# Patient Record
Sex: Male | Born: 1950 | Race: White | Hispanic: No | Marital: Married | Smoking: Never smoker
Health system: Southern US, Community
[De-identification: ages and names within clinical notes are randomized; demographics above are authoritative.]

## PROBLEM LIST (undated history)

## (undated) DIAGNOSIS — N400 Enlarged prostate without lower urinary tract symptoms: Secondary | ICD-10-CM

---

## 2008-04-11 ENCOUNTER — Observation Stay (HOSPITAL_COMMUNITY): Admission: EM | Admit: 2008-04-11 | Discharge: 2008-04-12 | Payer: Self-pay | Admitting: Emergency Medicine

## 2008-07-06 ENCOUNTER — Emergency Department (HOSPITAL_COMMUNITY): Admission: EM | Admit: 2008-07-06 | Discharge: 2008-07-06 | Payer: Self-pay | Admitting: Emergency Medicine

## 2009-03-02 ENCOUNTER — Emergency Department (HOSPITAL_COMMUNITY): Admission: EM | Admit: 2009-03-02 | Discharge: 2009-03-02 | Payer: Self-pay | Admitting: Emergency Medicine

## 2009-03-04 ENCOUNTER — Emergency Department (HOSPITAL_COMMUNITY): Admission: EM | Admit: 2009-03-04 | Discharge: 2009-03-04 | Payer: Self-pay | Admitting: Emergency Medicine

## 2011-02-07 NOTE — Discharge Summary (Signed)
NAME:  Micheal Murphy, Micheal Murphy                  ACCOUNT NO.:  o   MEDICAL RECORD NO.:  [id number]          PATIENT TYPE:  INP   LOCATION:  3038                         FACILITY:  [facility]   PHYSICIAN:  Hal T. Stoneking, M.D. DATE OF BIRTH:  [birth year]   DATE OF ADMISSION:  04/11/2008  DATE OF DISCHARGE:  04/12/2008                               DISCHARGE SUMMARY   ADMITTING DIAGNOSIS:  Amnestic syndrome.   DISCHARGE DIAGNOSES:  1. Transient global amnesia.  2. Plantar fasciitis on the right.  3. Benign prostatic hypertrophy.   BRIEF HISTORY:  The patient is a very nice 60 year old white male who  enjoys excellent health.  On the morning of admission, he had had sexual  intercourse with his wife and then was found in the kitchen staring  forward.  He states he cannot remember many things prior to that time  and his memory really did not start functioning properly until he had  reached the emergency room.  He had had no headache.  He did have a  little tingling in his right arm that he said was barely noticeable.  Since admission, he has had no additional complaints.   ADMISSION PHYSICAL EXAMINATION:  VITAL SIGNS:  Temperature 98.1 and  blood pressure 123/81.  LUNGS: Clear.  HEART:  Regular rate and rhythm without murmurs.  ABDOMEN:  Soft, nontender.  NEURO:  Cranial nerves II, III,  XII were intact.   LABORATORY DATA ON ADMISSION:  CT scan of the brain normal.  Sodium 139,  potassium 4.5, chloride 106, bicarb 26, BUN 17, creatinine 0.96, glucose  110, white count 7300, hemoglobin 16, MCV 91, platelet count 177,000.   HOSPITAL COURSE:  The patient was admitted to regular hospital bed.  His  Voltaren and Flomax were discontinued.  He was placed on aspirin 325 mg  a day.  He had an MRA of the cranial vessels, which was normal.  He had  an MRI, which was normal.  On the morning of discharge, he again had  totally normal memory and neurologic function.  Most likely, he had an  episode of  transient global amnesia.  Question whether or not the  Voltaren or Flomax played a role.  These medications will be  discontinued as they were started recently.  He will follow up with Dr.  Donette Larry in approximately 2 weeks.           ______________________________  Sunday Spillers Pete Glatter, M.D.     HTS/MEDQ  D:  04/12/2008  T:  04/12/2008  Job:  [id number]   cc:   Georgann Housekeeper, MD

## 2011-02-07 NOTE — H&P (Signed)
NAME:  Micheal Murphy, Micheal Murphy              ACCOUNT NO.:  [id number]   MEDICAL RECORD NO.:  [id number]          PATIENT TYPE:  INP   LOCATION:                                FACILITY:  [facility]   PHYSICIAN:  Hollice Espy, M.D.DATE OF BIRTH:  [birth year]   DATE OF ADMISSION:  04/11/2008  DATE OF DISCHARGE:  04/12/2008                              HISTORY & PHYSICAL   PRIMARY CARE PHYSICIAN:  Georgann Housekeeper, MD.   CHIEF COMPLAINT:  Memory loss.   HISTORY OF PRESENT ILLNESS:  Patient is a 60 year old white male with a  past medical history only of BPH and a recent episode of plantar  fasciitis who has been in previously good health and then this morning  engaged in an episode of intercourse.  Following that he went into the  kitchen and all of a sudden family noted the patient sort of blanked out  where he stared straight ahead, did not really move and this lasted for  about less than 60 seconds.  Following afterwards, the patient, when  talking to him, seemed to be alert and oriented but he did not have any  remembrance of events up until last night, including this episode of  intercourse that he just had.  They became concerned and brought the  patient in to the emergency room.  In the emergency room he had a CT  scan of the head done which was unremarkable.  The lab work was done on  the patient which was completely normal.  On his vital signs, he was  noted to be normotensive.  While in the emergency room, the patient is  complaining of some headache on the posterior aspect of the left side of  his head as well as some right facial numbness.  Because of these  complaints and history, he was concerned about the possibility of TIAs,  and it was felt that the patient needed to come in for further  evaluation and possible treatment.  Currently, the patient is doing  well.  Outside of the mild headache and facial numbness, he otherwise is  doing well.  He denies any vision changes, no dysphagia,  no chest pain,  palpitations, shortness of breath, wheeze, cough, abdominal pain,  hematuria, dysuria, constipation, diarrhea, focal extremity numbness,  weakness or pain.  Review of systems otherwise negative.   PAST MEDICAL HISTORY:  1. Prostate issues.  2. A recent case of right plantar fasciitis.   MEDICATIONS:  1. He was just started on Voltaren for his fasciitis.  2. He takes Flomax 0.4 once or twice a week.  3. He does take a multivitamin.   He has no known drug allergies.   SOCIAL HISTORY:  No tobacco, alcohol or drug use.   FAMILY HISTORY:  Noncontributory.  Note, the patient takes no Viagra, no  other medications, either prescription or otherwise, no herbal products.  in and.   PHYSICAL EXAMINATION:  VITALS ON ADMISSION:  Temperature 98.1, heart  rate 68, blood pressure 123/81, respirations 20, O2 sat 97% on room air.  CRANIAL NERVES:  II-XII are intact.  HEART:  Regular rate and rhythm, S1 S2, no murmur.  LUNGS:  Clear to auscultation bilaterally.  ABDOMEN:  Soft, nontender, nondistended.  Positive bowel sounds.  EXTREMITIES:  Show no clubbing, cyanosis or edema.  He has negative for  Babinski sign, no focal neurological deficits.  Strength is equal 5 to  5+ on both sides for flexion, extension and grip.   LAB WORK:  CT scan of the head is unremarkable.  Sodium 139, potassium  4.5, chloride 106, bicarb 26, BUN 17, creatinine 0.96, glucose 110.  White count 7.3, H&H 16 and 46, MCV of 91, platelet count 177,000, 64%  neutrophils.  A CT scan of the head is as per HPI.  EKG notes a right  bundle branch block.   ASSESSMENT AND PLAN:  1. Episode of memory loss now with some questionable right-sided      facial numbness, I wonder if this is a transient ischemic attack.      The patient just had a lipid profile done in the office and that      was found to be within normal limits.  Will check an MRI, MRA of      the head and start him on a daily aspirin  prophylactically.  2. Right bundle branch block.  No previous history of any heart      issues.  Will go ahead and check a 2D echocardiogram.  3. Benign prostatic hypertrophy.  He is on Flomax which he takes about      twice a week.  I have reviewed the literature and Flomax can cause      orthostatic hypotension.  If this workup is completely negative, I      question if he had a brief hypotensive episode and maybe needs to      be discontinued from this medication.      Hollice Espy, M.D.  Electronically Signed     SKK/MEDQ  D:  04/11/2008  T:  04/11/2008  Job:  [id number]   cc:   Georgann Housekeeper, MD

## 2011-06-23 LAB — DIFFERENTIAL
Eosinophils Absolute: 0
Eosinophils Relative: 1
Lymphs Abs: 2
Monocytes Absolute: 0.6
Monocytes Relative: 8

## 2011-06-23 LAB — CBC
HCT: 46.1
Hemoglobin: 16
MCV: 91.3
Platelets: 177
WBC: 7.3

## 2011-06-23 LAB — BASIC METABOLIC PANEL
BUN: 17
Chloride: 106
Potassium: 4.5
Sodium: 139

## 2019-07-24 ENCOUNTER — Other Ambulatory Visit: Payer: Self-pay

## 2019-07-24 ENCOUNTER — Ambulatory Visit
Admission: EM | Admit: 2019-07-24 | Discharge: 2019-07-24 | Disposition: A | Discharge status: Home / Self Care | Payer: Medicare Other

## 2019-07-24 DIAGNOSIS — S81812A Laceration without foreign body, left lower leg, initial encounter: Secondary | ICD-10-CM | POA: Diagnosis not present

## 2019-07-24 DIAGNOSIS — Z23 Encounter for immunization: Secondary | ICD-10-CM | POA: Diagnosis not present

## 2019-07-24 DIAGNOSIS — W293XXA Contact with powered garden and outdoor hand tools and machinery, initial encounter: Secondary | ICD-10-CM | POA: Diagnosis not present

## 2019-07-24 HISTORY — DX: Benign prostatic hyperplasia without lower urinary tract symptoms: N40.0

## 2019-07-24 MED ORDER — TETANUS-DIPHTH-ACELL PERTUSSIS 5-2.5-18.5 LF-MCG/0.5 IM SUSP
0.5000 mL | Freq: Once | INTRAMUSCULAR | Status: AC
  Administered 2019-07-24: 0.5 mL via INTRAMUSCULAR

## 2019-07-24 NOTE — ED Triage Notes (Signed)
Pt cut his LLE with a chain saw, 2in lac noted, bleeding controlled

## 2019-07-24 NOTE — ED Provider Notes (Signed)
[facility]    CSN: [id number] Arrival date & time: 07/24/19  1709      History   Chief Complaint Chief Complaint  Patient presents with  . Laceration    HPI Micheal Murphy is a 68 y.o. male presenting for left anterior lower leg laceration second to saw a chain 20 minutes PTA.  Bleeding controlled prior to arrival with direct pressure.  Patient denies anticoagulant use, though does take daily baby aspirin.  Patient unsure of last tetanus, agreeable to booster today.  Patient denies pain with ambulation stating "I just nicked it a little".  Past Medical History:  Diagnosis Date  . Enlarged prostate     There are no active problems to display for this patient.   History reviewed. No pertinent surgical history.     Home Medications    Prior to Admission medications   Medication Sig Start Date End Date Taking? Authorizing Provider  tamsulosin (FLOMAX) 0.4 MG CAPS capsule Take 0.4 mg by mouth.   Yes [provider]    Family History No family history on file.  Social History Social History   Tobacco Use  . Smoking status: Never Smoker  . Smokeless tobacco: Never Used  Substance Use Topics  . Alcohol use: Not Currently  . Drug use: Not on file     Allergies   Patient has no known allergies.   Review of Systems Review of Systems  Constitutional: Negative for fatigue and fever.  Respiratory: Negative for cough and shortness of breath.   Cardiovascular: Negative for chest pain and palpitations.  Gastrointestinal: Negative for abdominal pain, diarrhea and vomiting.  Musculoskeletal: Negative for arthralgias and myalgias.  Skin: Positive for wound. Negative for rash.  Neurological: Negative for speech difficulty, weakness and headaches.  All other systems reviewed and are negative.    Physical Exam Triage Vital Signs ED Triage Vitals  Enc Vitals Group     BP 07/24/19 1730 122/82     Pulse Rate 07/24/19 1730 80     Resp  07/24/19 1730 16     Temp 07/24/19 1730 98.3 F (36.8 C)     Temp Source 07/24/19 1730 Oral     SpO2 07/24/19 1730 98 %     Weight --      Height --      Head Circumference --      Peak Flow --      Pain Score 07/24/19 1735 0     Pain Loc --      Pain Edu? --      Excl. in [place]? --    No data found.  Updated Vital Signs BP 122/82 (BP Location: Left Arm)   Pulse 80   Temp 98.3 F (36.8 C) (Oral)   Resp 16   SpO2 98%   Visual Acuity Right Eye Distance:   Left Eye Distance:   Bilateral Distance:    Right Eye Near:   Left Eye Near:    Bilateral Near:     Physical Exam Constitutional:      General: He is not in acute distress. HENT:     Head: Normocephalic and atraumatic.  Eyes:     General: No scleral icterus.    Pupils: Pupils are equal, round, and reactive to light.  Cardiovascular:     Rate and Rhythm: Normal rate.  Pulmonary:     Effort: Pulmonary effort is normal.  Musculoskeletal: Normal range of motion.  Skin:    Capillary Refill: Capillary  refill takes less than 2 seconds.     Comments: A centimeter superficial laceration over left shin.  No foreign bodies or bone identified; fascia intact.    Neurological:     General: No focal deficit present.     Mental Status: He is alert and oriented to person, place, and time.     Sensory: No sensory deficit.     Motor: No weakness.     Gait: Gait normal.     Deep Tendon Reflexes: Reflexes normal.      UC Treatments / Results  Labs (all labs ordered are listed, but only abnormal results are displayed) Labs Reviewed - No data to display  EKG   Radiology No results found.  Procedures Laceration Repair  Date/Time: 07/26/2019 6:19 PM Performed by: Quincy Sheehan, PA-C Authorized by: Quincy Sheehan, PA-C   Consent:    Consent obtained:  Verbal   Consent given by:  Patient   Risks discussed:  Infection, need for additional repair, pain, poor cosmetic result and poor wound healing    Alternatives discussed:  No treatment and delayed treatment Universal protocol:    Patient identity confirmed:  Verbally with patient Anesthesia (see MAR for exact dosages):    Anesthesia method:  Local infiltration   Local anesthetic:  Lidocaine 2% WITH epi Laceration details:    Location:  Leg   Leg location:  L lower leg   Length (cm):  8   Depth (mm):  5 Repair type:    Repair type:  Intermediate Pre-procedure details:    Preparation:  Patient was prepped and draped in usual sterile fashion Exploration:    Hemostasis achieved with:  Direct pressure and tourniquet   Wound exploration: wound explored through full range of motion and entire depth of wound probed and visualized     Contaminated: no   Treatment:    Area cleansed with:  Saline and Betadine   Amount of cleaning:  Extensive   Irrigation solution:  Sterile saline   Irrigation volume:  50 cc   Irrigation method:  Pressure wash Skin repair:    Repair method:  Sutures   Suture size:  4-0   Suture material:  Prolene   Suture technique:  Simple interrupted   Number of sutures:  9 Approximation:    Approximation:  Close Post-procedure details:    Dressing:  Sterile dressing (Pressure dressing)   Patient tolerance of procedure:  Tolerated well, no immediate complications Comments:     Patient did have prolonged oozing from superficial varicose vein which resolved with the local infiltrate of lidocaine with epinephrine and direct pressure.  Estimated blood loss 5 cc.   (including critical care time)  Medications Ordered in UC Medications  Tdap (BOOSTRIX) injection 0.5 mL (0.5 mLs Intramuscular Given 07/24/19 1754)    Initial Impression / Assessment and Plan / UC Course  I have reviewed the triage vital signs and the nursing notes.  Pertinent labs & imaging results that were available during my care of the patient were reviewed by me and considered in my medical decision making (see chart for details).      Tetanus booster given in office which patient tolerated well.  9 simple interrupted sutures placed in office, patient tolerated well.  Patient return in 10 to 14 days for removal.  Will treat supportively as outlined below.  Return precautions discussed, patient verbalized understanding and is agreeable to plan. Final Clinical Impressions(s) / UC Diagnoses   Final diagnoses:  Leg laceration, left, initial  encounter     Discharge Instructions     Important to rest, ice, compress, elevate leg frequently. Apply pressure to help control any residual bleeding. May shower as normal: Do not need to thoroughly wash or apply Neosporin to laceration. Pat area dry, redress as needed. Return in 10 to 14 days for suture removal. Return sooner that is concerned about infection: Warmth, discharge, worsening pain, redness, fever.    ED Prescriptions    None     PDMP not reviewed this encounter.   Neldon Mc [place], [place] 07/26/19 1823

## 2019-07-24 NOTE — Discharge Instructions (Signed)
Important to rest, ice, compress, elevate leg frequently. Apply pressure to help control any residual bleeding. May shower as normal: Do not need to thoroughly wash or apply Neosporin to laceration. Pat area dry, redress as needed. Return in 10 to 14 days for suture removal. Return sooner that is concerned about infection: Warmth, discharge, worsening pain, redness, fever.

## 2019-07-26 DIAGNOSIS — S81812A Laceration without foreign body, left lower leg, initial encounter: Secondary | ICD-10-CM | POA: Diagnosis not present

## 2019-09-02 ENCOUNTER — Ambulatory Visit
Admission: EM | Admit: 2019-09-02 | Discharge: 2019-09-02 | Disposition: A | Discharge status: Home / Self Care | Payer: Medicare Other | Attending: Physician Assistant | Admitting: Physician Assistant

## 2019-09-02 DIAGNOSIS — Z20828 Contact with and (suspected) exposure to other viral communicable diseases: Secondary | ICD-10-CM | POA: Diagnosis not present

## 2019-09-02 DIAGNOSIS — Z20822 Contact with and (suspected) exposure to covid-19: Secondary | ICD-10-CM

## 2019-09-02 NOTE — ED Triage Notes (Signed)
Pt states exposure to positive covid from grandson on 12/1. Pt denies any sx's

## 2019-09-02 NOTE — Discharge Instructions (Signed)
COVID testing ordered. As discussed, given recent exposure without symptoms, you may still be in incubation period. Monitor for any symptoms such as cough, congestion, shortness of breath, loss of taste/smell, fever, to start self quarantine and may need retesting. Go to the emergency department for further evaluation if you develop significant shortness of breath, cannot speak in full sentences.  

## 2019-09-02 NOTE — ED Provider Notes (Signed)
[facility]    CSN: [id number] Arrival date & time: 09/02/19  1308      History   Chief Complaint Chief Complaint  Patient presents with  . positive COVID exposure    HPI Micheal Murphy is a 68 y.o. male.   68 year old male comes in for COVID testing after positive exposure 7 days ago. Patient remains asymptomatic. Denies URI symptoms such as cough, congestion, sore throat. Denies fever, chills, body aches. Denies abdominal pain, nausea, vomiting, diarrhea. Denies shortness of breath, loss of taste/smell. No antipyretics in the last 8 hours.       Past Medical History:  Diagnosis Date  . Enlarged prostate     There are no active problems to display for this patient.   History reviewed. No pertinent surgical history.     Home Medications    Prior to Admission medications   Medication Sig Start Date End Date Taking? Authorizing Provider  tamsulosin (FLOMAX) 0.4 MG CAPS capsule Take 0.4 mg by mouth.    [provider]    Family History No family history on file.  Social History Social History   Tobacco Use  . Smoking status: Never Smoker  . Smokeless tobacco: Never Used  Substance Use Topics  . Alcohol use: Not Currently  . Drug use: Not on file     Allergies   Patient has no known allergies.   Review of Systems Review of Systems  Reason unable to perform ROS: See HPI as above.     Physical Exam Triage Vital Signs ED Triage Vitals [09/02/19 1350]  Enc Vitals Group     BP 135/90     Pulse Rate 69     Resp 18     Temp 98.3 F (36.8 C)     Temp Source Oral     SpO2 96 %     Weight      Height      Head Circumference      Peak Flow      Pain Score 0     Pain Loc      Pain Edu?      Excl. in [place]?    No data found.  Updated Vital Signs BP 135/90 (BP Location: Left Arm)   Pulse 69   Temp 98.3 F (36.8 C) (Oral)   Resp 18   SpO2 96%   Physical Exam Constitutional:      General: He is not in acute  distress.    Appearance: Normal appearance. He is not ill-appearing, toxic-appearing or diaphoretic.  HENT:     Head: Normocephalic and atraumatic.     Mouth/Throat:     Mouth: Mucous membranes are moist.     Pharynx: Oropharynx is clear. Uvula midline.  Neck:     Musculoskeletal: Normal range of motion and neck supple.  Cardiovascular:     Rate and Rhythm: Normal rate and regular rhythm.     Heart sounds: Normal heart sounds. No murmur. No friction rub. No gallop.   Pulmonary:     Effort: Pulmonary effort is normal. No accessory muscle usage, prolonged expiration, respiratory distress or retractions.     Comments: Lungs clear to auscultation without adventitious lung sounds. Skin:    General: Skin is warm and dry.  Neurological:     General: No focal deficit present.     Mental Status: He is alert and oriented to person, place, and time.     UC Treatments / Results  Labs (all labs ordered are listed, but only abnormal results are displayed) Labs Reviewed  NOVEL CORONAVIRUS, NAA    EKG   Radiology No results found.  Procedures Procedures (including critical care time)  Medications Ordered in UC Medications - No data to display  Initial Impression / Assessment and Plan / UC Course  I have reviewed the triage vital signs and the nursing notes.  Pertinent labs & imaging results that were available during my care of the patient were reviewed by me and considered in my medical decision making (see chart for details).    Discussed with patient, given positive exposure without symptoms, could still be within incubation period. If develop symptoms, may need retesting.  Patient expresses understanding and would like to proceed with testing.  COVID testing ordered.  Patient will continue to monitor symptoms.  To self quarantine if develop any symptoms.  Return precautions given.  Final Clinical Impressions(s) / UC Diagnoses   Final diagnoses:  Exposure to COVID-19 virus    ED Prescriptions    None     PDMP not reviewed this encounter.   Ok Edwards, PA-C 09/02/19 1421

## 2019-09-04 LAB — NOVEL CORONAVIRUS, NAA: SARS-CoV-2, NAA: NOT DETECTED

## 2020-10-12 DIAGNOSIS — H02839 Dermatochalasis of unspecified eye, unspecified eyelid: Secondary | ICD-10-CM | POA: Diagnosis not present

## 2020-11-02 DIAGNOSIS — L820 Inflamed seborrheic keratosis: Secondary | ICD-10-CM | POA: Diagnosis not present

## 2020-11-02 DIAGNOSIS — D485 Neoplasm of uncertain behavior of skin: Secondary | ICD-10-CM | POA: Diagnosis not present

## 2020-11-10 DIAGNOSIS — H259 Unspecified age-related cataract: Secondary | ICD-10-CM | POA: Diagnosis not present

## 2020-11-11 DIAGNOSIS — M25561 Pain in right knee: Secondary | ICD-10-CM | POA: Diagnosis not present

## 2020-11-11 DIAGNOSIS — M2352 Chronic instability of knee, left knee: Secondary | ICD-10-CM | POA: Diagnosis not present

## 2020-11-11 DIAGNOSIS — M170 Bilateral primary osteoarthritis of knee: Secondary | ICD-10-CM | POA: Diagnosis not present

## 2020-11-11 DIAGNOSIS — M2342 Loose body in knee, left knee: Secondary | ICD-10-CM | POA: Diagnosis not present

## 2020-11-15 ENCOUNTER — Other Ambulatory Visit: Payer: Self-pay | Admitting: Orthopedic Surgery

## 2020-11-15 DIAGNOSIS — G8929 Other chronic pain: Secondary | ICD-10-CM

## 2020-11-15 DIAGNOSIS — M2342 Loose body in knee, left knee: Secondary | ICD-10-CM

## 2020-11-15 DIAGNOSIS — M25562 Pain in left knee: Secondary | ICD-10-CM

## 2020-11-28 ENCOUNTER — Other Ambulatory Visit: Payer: Self-pay

## 2020-11-28 ENCOUNTER — Ambulatory Visit
Admission: RE | Admit: 2020-11-28 | Discharge: 2020-11-28 | Disposition: A | Discharge status: Home / Self Care | Payer: Medicare Other | Source: Ambulatory Visit | Attending: Orthopedic Surgery | Admitting: Orthopedic Surgery

## 2020-11-28 DIAGNOSIS — G8929 Other chronic pain: Secondary | ICD-10-CM

## 2020-11-28 DIAGNOSIS — M2342 Loose body in knee, left knee: Secondary | ICD-10-CM

## 2020-11-28 DIAGNOSIS — M25562 Pain in left knee: Secondary | ICD-10-CM | POA: Diagnosis not present

## 2020-12-02 DIAGNOSIS — M1712 Unilateral primary osteoarthritis, left knee: Secondary | ICD-10-CM | POA: Diagnosis not present

## 2020-12-02 DIAGNOSIS — S83232A Complex tear of medial meniscus, current injury, left knee, initial encounter: Secondary | ICD-10-CM | POA: Diagnosis not present

## 2020-12-02 DIAGNOSIS — M7122 Synovial cyst of popliteal space [Baker], left knee: Secondary | ICD-10-CM | POA: Diagnosis not present

## 2020-12-08 DIAGNOSIS — M94262 Chondromalacia, left knee: Secondary | ICD-10-CM | POA: Diagnosis not present

## 2020-12-08 DIAGNOSIS — G8918 Other acute postprocedural pain: Secondary | ICD-10-CM | POA: Diagnosis not present

## 2020-12-08 DIAGNOSIS — M6752 Plica syndrome, left knee: Secondary | ICD-10-CM | POA: Diagnosis not present

## 2020-12-08 DIAGNOSIS — M659 Synovitis and tenosynovitis, unspecified: Secondary | ICD-10-CM | POA: Diagnosis not present

## 2020-12-08 DIAGNOSIS — S83232A Complex tear of medial meniscus, current injury, left knee, initial encounter: Secondary | ICD-10-CM | POA: Diagnosis not present

## 2020-12-08 DIAGNOSIS — M65862 Other synovitis and tenosynovitis, left lower leg: Secondary | ICD-10-CM | POA: Diagnosis not present

## 2020-12-14 DIAGNOSIS — Z9889 Other specified postprocedural states: Secondary | ICD-10-CM | POA: Diagnosis not present

## 2020-12-14 DIAGNOSIS — M2352 Chronic instability of knee, left knee: Secondary | ICD-10-CM | POA: Diagnosis not present

## 2021-02-04 DIAGNOSIS — R311 Benign essential microscopic hematuria: Secondary | ICD-10-CM | POA: Diagnosis not present

## 2021-02-04 DIAGNOSIS — R3912 Poor urinary stream: Secondary | ICD-10-CM | POA: Diagnosis not present

## 2021-02-04 DIAGNOSIS — R3121 Asymptomatic microscopic hematuria: Secondary | ICD-10-CM | POA: Diagnosis not present

## 2021-02-04 DIAGNOSIS — R350 Frequency of micturition: Secondary | ICD-10-CM | POA: Diagnosis not present

## 2021-02-22 DIAGNOSIS — R3129 Other microscopic hematuria: Secondary | ICD-10-CM | POA: Diagnosis not present

## 2021-02-22 DIAGNOSIS — R350 Frequency of micturition: Secondary | ICD-10-CM | POA: Diagnosis not present

## 2021-02-22 DIAGNOSIS — N3289 Other specified disorders of bladder: Secondary | ICD-10-CM | POA: Diagnosis not present

## 2021-02-22 DIAGNOSIS — R3121 Asymptomatic microscopic hematuria: Secondary | ICD-10-CM | POA: Diagnosis not present

## 2021-05-23 DIAGNOSIS — D1801 Hemangioma of skin and subcutaneous tissue: Secondary | ICD-10-CM | POA: Diagnosis not present

## 2021-05-23 DIAGNOSIS — L570 Actinic keratosis: Secondary | ICD-10-CM | POA: Diagnosis not present

## 2021-05-23 DIAGNOSIS — D229 Melanocytic nevi, unspecified: Secondary | ICD-10-CM | POA: Diagnosis not present

## 2021-05-23 DIAGNOSIS — L812 Freckles: Secondary | ICD-10-CM | POA: Diagnosis not present

## 2021-05-23 DIAGNOSIS — L814 Other melanin hyperpigmentation: Secondary | ICD-10-CM | POA: Diagnosis not present

## 2021-05-23 DIAGNOSIS — L821 Other seborrheic keratosis: Secondary | ICD-10-CM | POA: Diagnosis not present

## 2021-05-23 DIAGNOSIS — L819 Disorder of pigmentation, unspecified: Secondary | ICD-10-CM | POA: Diagnosis not present

## 2021-05-23 DIAGNOSIS — L918 Other hypertrophic disorders of the skin: Secondary | ICD-10-CM | POA: Diagnosis not present

## 2021-05-26 DIAGNOSIS — Z136 Encounter for screening for cardiovascular disorders: Secondary | ICD-10-CM | POA: Diagnosis not present

## 2021-05-26 DIAGNOSIS — R7303 Prediabetes: Secondary | ICD-10-CM | POA: Diagnosis not present

## 2021-05-26 DIAGNOSIS — J309 Allergic rhinitis, unspecified: Secondary | ICD-10-CM | POA: Diagnosis not present

## 2021-05-26 DIAGNOSIS — Z13220 Encounter for screening for lipoid disorders: Secondary | ICD-10-CM | POA: Diagnosis not present

## 2021-05-26 DIAGNOSIS — R3129 Other microscopic hematuria: Secondary | ICD-10-CM | POA: Diagnosis not present

## 2021-05-26 DIAGNOSIS — Z1389 Encounter for screening for other disorder: Secondary | ICD-10-CM | POA: Diagnosis not present

## 2021-05-26 DIAGNOSIS — Z0000 Encounter for general adult medical examination without abnormal findings: Secondary | ICD-10-CM | POA: Diagnosis not present

## 2021-10-28 DIAGNOSIS — R3912 Poor urinary stream: Secondary | ICD-10-CM | POA: Diagnosis not present

## 2021-10-28 DIAGNOSIS — R350 Frequency of micturition: Secondary | ICD-10-CM | POA: Diagnosis not present

## 2021-11-22 DIAGNOSIS — C44229 Squamous cell carcinoma of skin of left ear and external auricular canal: Secondary | ICD-10-CM | POA: Diagnosis not present

## 2021-11-22 DIAGNOSIS — L821 Other seborrheic keratosis: Secondary | ICD-10-CM | POA: Diagnosis not present

## 2021-11-22 DIAGNOSIS — L570 Actinic keratosis: Secondary | ICD-10-CM | POA: Diagnosis not present

## 2021-11-22 DIAGNOSIS — D492 Neoplasm of unspecified behavior of bone, soft tissue, and skin: Secondary | ICD-10-CM | POA: Diagnosis not present

## 2021-11-22 DIAGNOSIS — D225 Melanocytic nevi of trunk: Secondary | ICD-10-CM | POA: Diagnosis not present

## 2021-11-22 DIAGNOSIS — L814 Other melanin hyperpigmentation: Secondary | ICD-10-CM | POA: Diagnosis not present

## 2021-11-22 DIAGNOSIS — L728 Other follicular cysts of the skin and subcutaneous tissue: Secondary | ICD-10-CM | POA: Diagnosis not present

## 2022-01-16 DIAGNOSIS — D0422 Carcinoma in situ of skin of left ear and external auricular canal: Secondary | ICD-10-CM | POA: Diagnosis not present

## 2022-01-18 IMAGING — MR MR KNEE*L* W/O CM
4 of 6 series · 22 of 40 positions shown · non-contrast
Comparison: None.

CLINICAL DATA: Medial left knee pain for 2 months

EXAM:
MRI OF THE LEFT KNEE WITHOUT CONTRAST
TECHNIQUE: Multiplanar, multisequence MR imaging of the knee was performed. No
intravenous contrast was administered.

[Series 4: T2 fat-sat · coronal · 4.0mm · 0.59mm/px · 6 of 27 slices shown (1 of 2)]
[im 1/27]
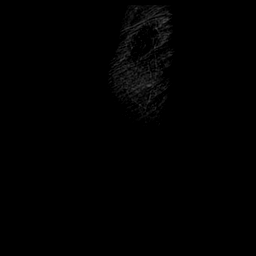
[im 6/27]
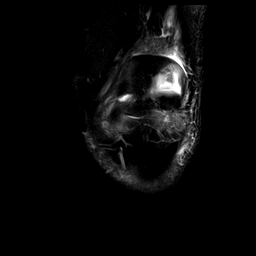
[im 11/27]
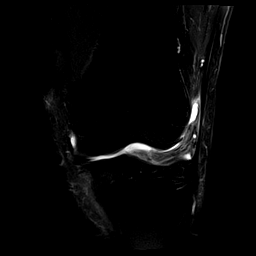
[im 16/27]
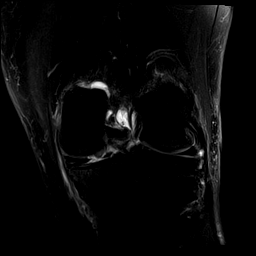
[im 21/27]
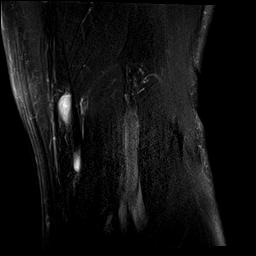
[im 27/27]
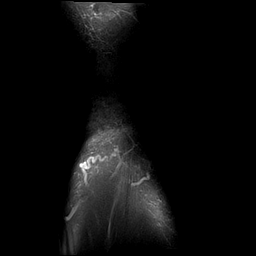

[Series 5: T1 · coronal · 4.0mm · 0.29mm/px · 3 of 27 slices shown]
[im 6/27]
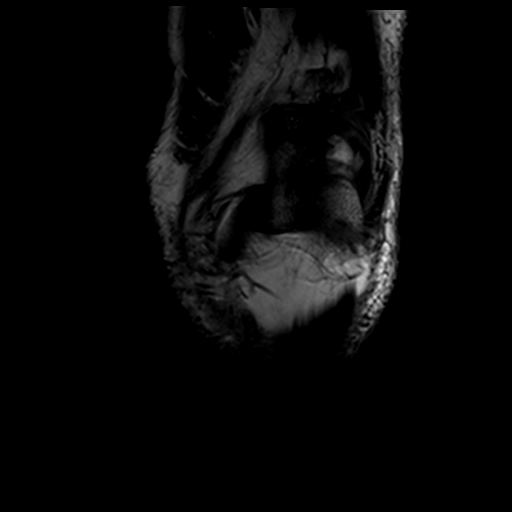
[im 16/27]
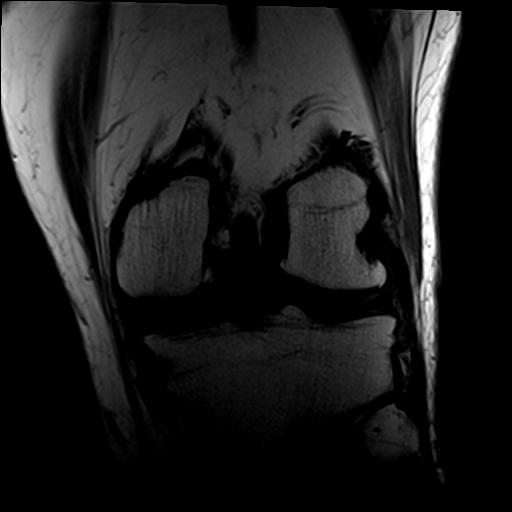
[im 27/27]
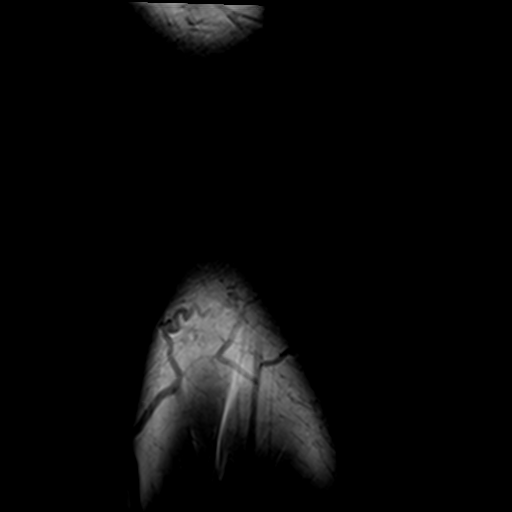

[Series 7: T2 fat-sat · sagittal · 3.0mm · 0.29mm/px · 6 of 29 slices shown (2 of 2)]
[im 1/29]
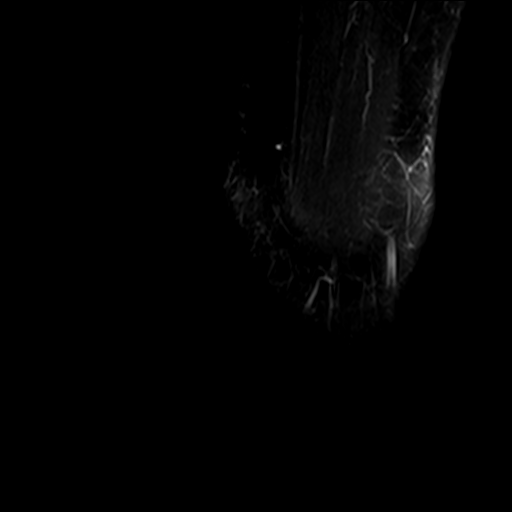
[im 5/29]
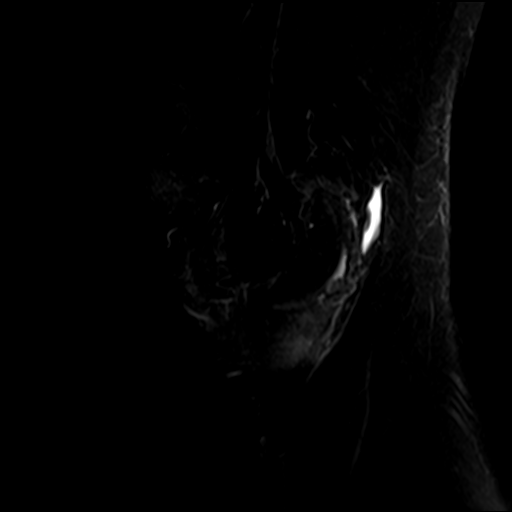
[im 10/29]
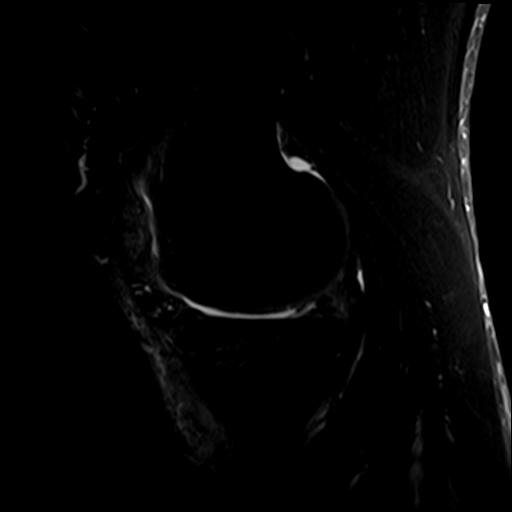
[im 15/29]
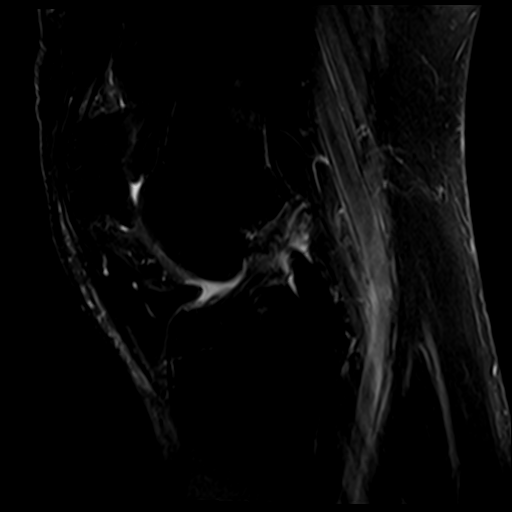
[im 19/29]
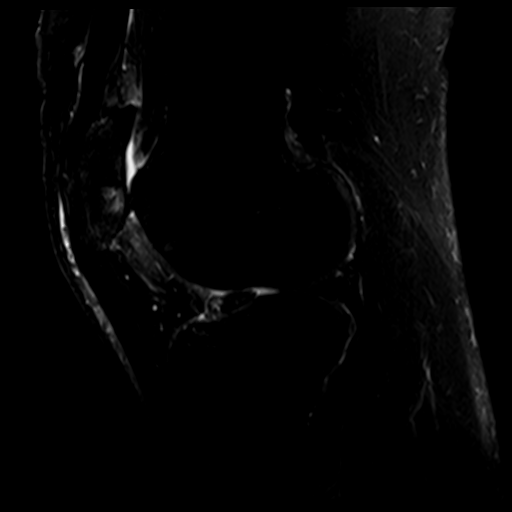
[im 24/29]
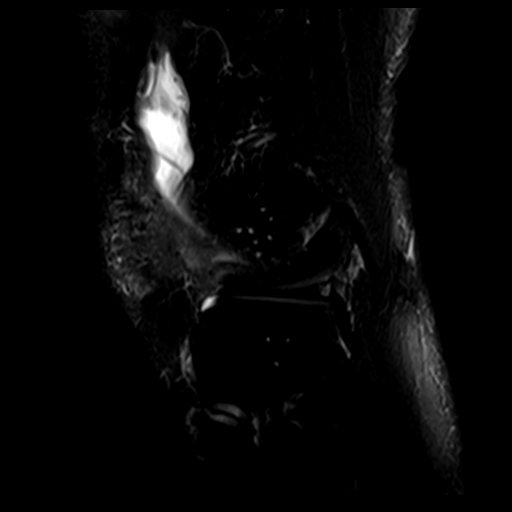

[Series 8: PD fat-sat · sagittal · 3.0mm · 0.29mm/px · 7 of 29 slices shown]
[im 1/29]
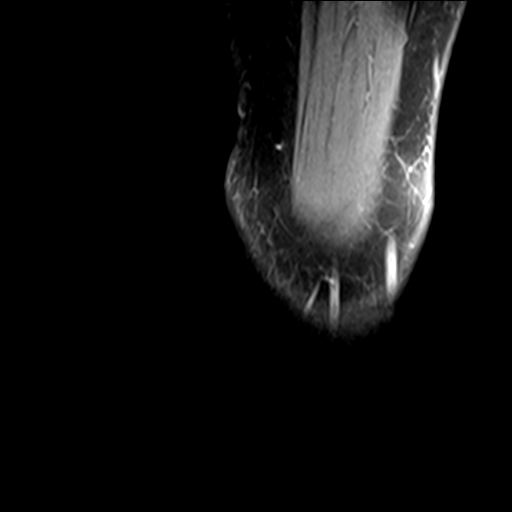
[im 5/29]
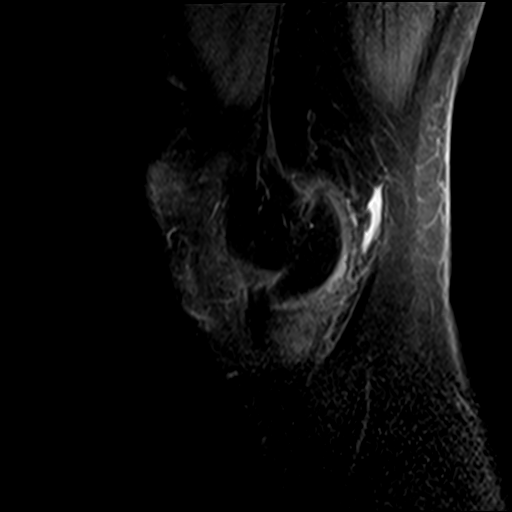
[im 10/29]
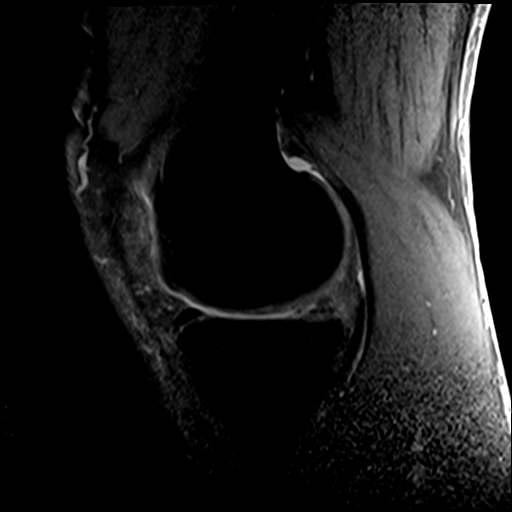
[im 15/29]
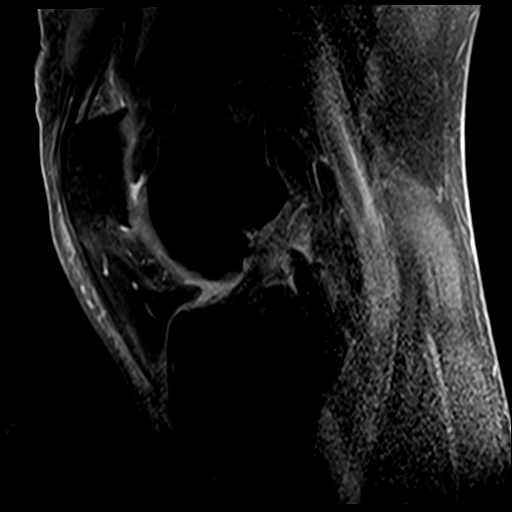
[im 19/29]
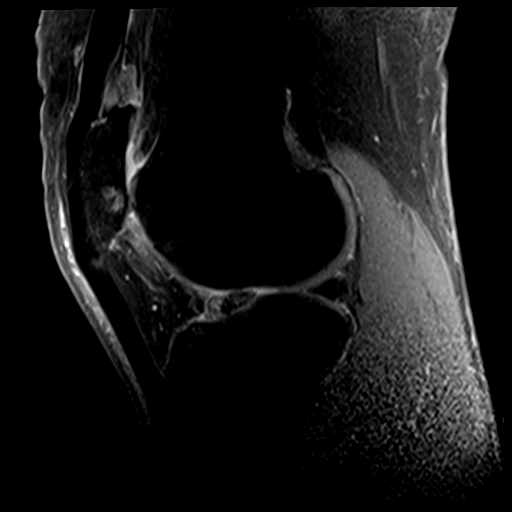
[im 24/29]
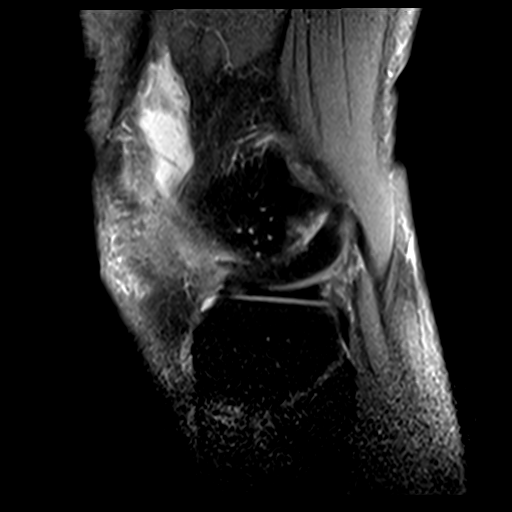
[im 29/29]
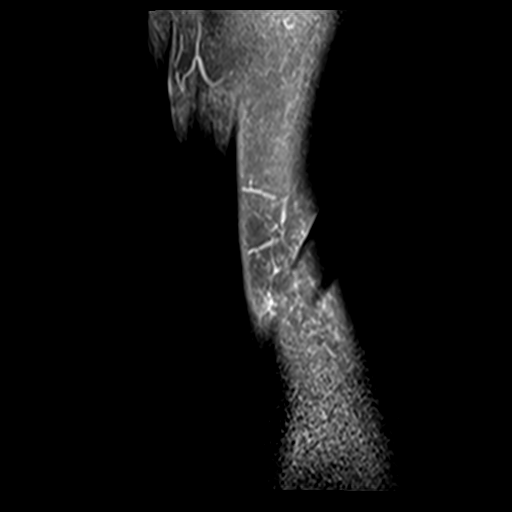

[22 of 40 positions shown; findings below may reference images not displayed]

FINDINGS: MENISCI

Medial meniscus: Complex tears of the medial meniscus with irregular
radial component of the posterior horn and horizontal component
extending into the body segment (series 6, images 7-11).

Lateral meniscus: Intrasubstance degeneration of the anterior horn
with intrameniscal cyst formation near the anterior root attachment
(series 8, image 18).

LIGAMENTS

Cruciates:  Intact ACL and PCL.

Collaterals: Medial collateral ligament is intact. Lateral
collateral ligament complex is intact.

CARTILAGE

Patellofemoral: High-grade cartilage loss of the patellofemoral
compartment most severely involving the lateral patellar facet and
lateral trochlea where there is full-thickness cartilage loss and
subchondral cystic changes.

Medial: Mild chondral thinning and surface irregularity with a 4 mm
partial-thickness cartilage defect of the central medial femoral
condyle (series 4, image 12).

Lateral:  No chondral defect.

Joint: Trace joint effusion. Focal edema within the superolateral
aspect of Hoffa's fat. Mild edema within the suprapatellar fat pad.

Popliteal Fossa:  Tiny Baker's cyst.  Intact popliteus tendon.

Extensor Mechanism:  Intact quadriceps tendon and patellar tendon.

Bones: Mild tricompartmental joint space narrowing with marginal
osteophyte formation. Degenerative subchondral marrow signal changes
within the lateral aspect of the patellofemoral compartment. No
fracture. No malalignment. No suspicious bone lesion.

Other: None.
IMPRESSION: 1. Complex tears of the medial meniscus.
2. Intrasubstance degeneration of the anterior horn of the lateral
meniscus with intrameniscal cyst formation near the anterior root
attachment.
3. High-grade cartilage loss of the patellofemoral compartment most
severely involving the lateral patellar facet and lateral trochlea.
4. Focal edema within the superolateral aspect of Hoffa's fat pad,
which can be seen in the setting of patellar tendon-lateral femoral
condyle friction syndrome.
5. Trace joint effusion. Tiny Baker's cyst.

## 2022-02-15 DIAGNOSIS — S01302A Unspecified open wound of left ear, initial encounter: Secondary | ICD-10-CM | POA: Diagnosis not present

## 2022-03-03 DIAGNOSIS — R350 Frequency of micturition: Secondary | ICD-10-CM | POA: Diagnosis not present

## 2022-03-03 DIAGNOSIS — R3912 Poor urinary stream: Secondary | ICD-10-CM | POA: Diagnosis not present

## 2022-03-09 DIAGNOSIS — Z86010 Personal history of colonic polyps: Secondary | ICD-10-CM | POA: Diagnosis not present

## 2022-03-09 DIAGNOSIS — K635 Polyp of colon: Secondary | ICD-10-CM | POA: Diagnosis not present

## 2022-03-09 DIAGNOSIS — Z800 Family history of malignant neoplasm of digestive organs: Secondary | ICD-10-CM | POA: Diagnosis not present

## 2022-03-09 DIAGNOSIS — K648 Other hemorrhoids: Secondary | ICD-10-CM | POA: Diagnosis not present

## 2022-03-09 DIAGNOSIS — Z09 Encounter for follow-up examination after completed treatment for conditions other than malignant neoplasm: Secondary | ICD-10-CM | POA: Diagnosis not present

## 2022-03-14 DIAGNOSIS — K635 Polyp of colon: Secondary | ICD-10-CM | POA: Diagnosis not present

## 2022-05-24 DIAGNOSIS — L814 Other melanin hyperpigmentation: Secondary | ICD-10-CM | POA: Diagnosis not present

## 2022-05-24 DIAGNOSIS — L821 Other seborrheic keratosis: Secondary | ICD-10-CM | POA: Diagnosis not present

## 2022-05-24 DIAGNOSIS — Z08 Encounter for follow-up examination after completed treatment for malignant neoplasm: Secondary | ICD-10-CM | POA: Diagnosis not present

## 2022-05-24 DIAGNOSIS — Z85828 Personal history of other malignant neoplasm of skin: Secondary | ICD-10-CM | POA: Diagnosis not present

## 2022-05-24 DIAGNOSIS — D225 Melanocytic nevi of trunk: Secondary | ICD-10-CM | POA: Diagnosis not present

## 2022-06-08 DIAGNOSIS — Z1389 Encounter for screening for other disorder: Secondary | ICD-10-CM | POA: Diagnosis not present

## 2022-06-08 DIAGNOSIS — J309 Allergic rhinitis, unspecified: Secondary | ICD-10-CM | POA: Diagnosis not present

## 2022-06-08 DIAGNOSIS — Z0000 Encounter for general adult medical examination without abnormal findings: Secondary | ICD-10-CM | POA: Diagnosis not present

## 2022-06-08 DIAGNOSIS — Z13220 Encounter for screening for lipoid disorders: Secondary | ICD-10-CM | POA: Diagnosis not present

## 2022-06-08 DIAGNOSIS — R7303 Prediabetes: Secondary | ICD-10-CM | POA: Diagnosis not present

## 2022-06-08 DIAGNOSIS — Z23 Encounter for immunization: Secondary | ICD-10-CM | POA: Diagnosis not present

## 2022-06-08 DIAGNOSIS — Z136 Encounter for screening for cardiovascular disorders: Secondary | ICD-10-CM | POA: Diagnosis not present

## 2022-12-13 DIAGNOSIS — L728 Other follicular cysts of the skin and subcutaneous tissue: Secondary | ICD-10-CM | POA: Diagnosis not present

## 2023-05-29 DIAGNOSIS — Z85828 Personal history of other malignant neoplasm of skin: Secondary | ICD-10-CM | POA: Diagnosis not present

## 2023-05-29 DIAGNOSIS — Z08 Encounter for follow-up examination after completed treatment for malignant neoplasm: Secondary | ICD-10-CM | POA: Diagnosis not present

## 2023-05-29 DIAGNOSIS — L570 Actinic keratosis: Secondary | ICD-10-CM | POA: Diagnosis not present

## 2023-05-29 DIAGNOSIS — L821 Other seborrheic keratosis: Secondary | ICD-10-CM | POA: Diagnosis not present

## 2023-05-29 DIAGNOSIS — D225 Melanocytic nevi of trunk: Secondary | ICD-10-CM | POA: Diagnosis not present

## 2023-05-29 DIAGNOSIS — L814 Other melanin hyperpigmentation: Secondary | ICD-10-CM | POA: Diagnosis not present

## 2023-06-11 DIAGNOSIS — Z0000 Encounter for general adult medical examination without abnormal findings: Secondary | ICD-10-CM | POA: Diagnosis not present

## 2023-06-11 DIAGNOSIS — D692 Other nonthrombocytopenic purpura: Secondary | ICD-10-CM | POA: Diagnosis not present

## 2023-06-11 DIAGNOSIS — J309 Allergic rhinitis, unspecified: Secondary | ICD-10-CM | POA: Diagnosis not present

## 2023-06-11 DIAGNOSIS — Z13220 Encounter for screening for lipoid disorders: Secondary | ICD-10-CM | POA: Diagnosis not present

## 2023-06-11 DIAGNOSIS — Z136 Encounter for screening for cardiovascular disorders: Secondary | ICD-10-CM | POA: Diagnosis not present

## 2023-06-11 DIAGNOSIS — R7303 Prediabetes: Secondary | ICD-10-CM | POA: Diagnosis not present

## 2023-08-09 DIAGNOSIS — Z135 Encounter for screening for eye and ear disorders: Secondary | ICD-10-CM | POA: Diagnosis not present

## 2023-08-09 DIAGNOSIS — H5203 Hypermetropia, bilateral: Secondary | ICD-10-CM | POA: Diagnosis not present

## 2023-08-09 DIAGNOSIS — H524 Presbyopia: Secondary | ICD-10-CM | POA: Diagnosis not present

## 2023-08-09 DIAGNOSIS — H259 Unspecified age-related cataract: Secondary | ICD-10-CM | POA: Diagnosis not present

## 2023-08-09 DIAGNOSIS — H52223 Regular astigmatism, bilateral: Secondary | ICD-10-CM | POA: Diagnosis not present

## 2023-09-14 DIAGNOSIS — Z03818 Encounter for observation for suspected exposure to other biological agents ruled out: Secondary | ICD-10-CM | POA: Diagnosis not present

## 2023-09-14 DIAGNOSIS — J069 Acute upper respiratory infection, unspecified: Secondary | ICD-10-CM | POA: Diagnosis not present

## 2024-05-28 DIAGNOSIS — L918 Other hypertrophic disorders of the skin: Secondary | ICD-10-CM | POA: Diagnosis not present

## 2024-05-28 DIAGNOSIS — Z08 Encounter for follow-up examination after completed treatment for malignant neoplasm: Secondary | ICD-10-CM | POA: Diagnosis not present

## 2024-05-28 DIAGNOSIS — L821 Other seborrheic keratosis: Secondary | ICD-10-CM | POA: Diagnosis not present

## 2024-05-28 DIAGNOSIS — Z85828 Personal history of other malignant neoplasm of skin: Secondary | ICD-10-CM | POA: Diagnosis not present

## 2024-05-28 DIAGNOSIS — L570 Actinic keratosis: Secondary | ICD-10-CM | POA: Diagnosis not present

## 2024-05-28 DIAGNOSIS — D225 Melanocytic nevi of trunk: Secondary | ICD-10-CM | POA: Diagnosis not present

## 2024-05-28 DIAGNOSIS — L814 Other melanin hyperpigmentation: Secondary | ICD-10-CM | POA: Diagnosis not present

## 2024-06-27 DIAGNOSIS — Z0000 Encounter for general adult medical examination without abnormal findings: Secondary | ICD-10-CM | POA: Diagnosis not present

## 2024-06-27 DIAGNOSIS — D692 Other nonthrombocytopenic purpura: Secondary | ICD-10-CM | POA: Diagnosis not present

## 2024-06-27 DIAGNOSIS — Z23 Encounter for immunization: Secondary | ICD-10-CM | POA: Diagnosis not present

## 2024-06-27 DIAGNOSIS — J309 Allergic rhinitis, unspecified: Secondary | ICD-10-CM | POA: Diagnosis not present

## 2024-06-27 DIAGNOSIS — H9319 Tinnitus, unspecified ear: Secondary | ICD-10-CM | POA: Diagnosis not present

## 2024-06-27 DIAGNOSIS — R7303 Prediabetes: Secondary | ICD-10-CM | POA: Diagnosis not present

## 2024-06-27 DIAGNOSIS — Z136 Encounter for screening for cardiovascular disorders: Secondary | ICD-10-CM | POA: Diagnosis not present
